# Patient Record
Sex: Female | Born: 1950 | Race: White | Hispanic: No | Marital: Married | Smoking: Former smoker
Health system: Southern US, Community
[De-identification: ages and names within clinical notes are randomized; demographics above are authoritative.]

## PROBLEM LIST (undated history)

## (undated) DIAGNOSIS — G20 Parkinson's disease: Secondary | ICD-10-CM

## (undated) DIAGNOSIS — G20A1 Parkinson's disease without dyskinesia, without mention of fluctuations: Secondary | ICD-10-CM

## (undated) DIAGNOSIS — E039 Hypothyroidism, unspecified: Secondary | ICD-10-CM

## (undated) DIAGNOSIS — G709 Myoneural disorder, unspecified: Secondary | ICD-10-CM

## (undated) DIAGNOSIS — K219 Gastro-esophageal reflux disease without esophagitis: Secondary | ICD-10-CM

## (undated) DIAGNOSIS — E785 Hyperlipidemia, unspecified: Secondary | ICD-10-CM

## (undated) DIAGNOSIS — M810 Age-related osteoporosis without current pathological fracture: Secondary | ICD-10-CM

## (undated) HISTORY — PX: KNEE SURGERY: SHX244

## (undated) HISTORY — PX: APPENDECTOMY: SHX54

---

## 2004-12-20 ENCOUNTER — Ambulatory Visit: Payer: Self-pay | Admitting: Internal Medicine

## 2005-02-25 ENCOUNTER — Ambulatory Visit: Payer: Self-pay | Admitting: Gastroenterology

## 2006-02-11 ENCOUNTER — Ambulatory Visit: Payer: Self-pay | Admitting: Internal Medicine

## 2006-03-11 ENCOUNTER — Ambulatory Visit: Payer: Self-pay | Admitting: Internal Medicine

## 2007-02-24 ENCOUNTER — Ambulatory Visit: Payer: Self-pay | Admitting: Internal Medicine

## 2008-02-25 ENCOUNTER — Ambulatory Visit: Payer: Self-pay | Admitting: Internal Medicine

## 2009-03-29 ENCOUNTER — Ambulatory Visit: Payer: Self-pay | Admitting: Internal Medicine

## 2010-04-02 ENCOUNTER — Ambulatory Visit: Payer: Self-pay | Admitting: Internal Medicine

## 2011-05-29 ENCOUNTER — Ambulatory Visit: Payer: Self-pay | Admitting: Internal Medicine

## 2012-08-05 ENCOUNTER — Ambulatory Visit: Payer: Self-pay | Admitting: Internal Medicine

## 2013-06-29 ENCOUNTER — Ambulatory Visit: Payer: Self-pay | Admitting: Neurology

## 2013-08-17 ENCOUNTER — Ambulatory Visit: Payer: Self-pay | Admitting: Internal Medicine

## 2013-09-01 ENCOUNTER — Emergency Department: Payer: Self-pay | Admitting: Emergency Medicine

## 2013-09-01 LAB — CBC WITH DIFFERENTIAL/PLATELET
Basophil #: 0 10*3/uL (ref 0.0–0.1)
Basophil %: 0.8 %
EOS ABS: 0.1 10*3/uL (ref 0.0–0.7)
Eosinophil %: 1.9 %
HCT: 38.9 % (ref 35.0–47.0)
HGB: 13 g/dL (ref 12.0–16.0)
LYMPHS ABS: 1.7 10*3/uL (ref 1.0–3.6)
LYMPHS PCT: 33 %
MCH: 31.6 pg (ref 26.0–34.0)
MCHC: 33.5 g/dL (ref 32.0–36.0)
MCV: 94 fL (ref 80–100)
Monocyte #: 0.5 x10 3/mm (ref 0.2–0.9)
Monocyte %: 8.6 %
NEUTROS ABS: 2.9 10*3/uL (ref 1.4–6.5)
Neutrophil %: 55.7 %
PLATELETS: 226 10*3/uL (ref 150–440)
RBC: 4.12 10*6/uL (ref 3.80–5.20)
RDW: 13.7 % (ref 11.5–14.5)
WBC: 5.3 10*3/uL (ref 3.6–11.0)

## 2013-09-01 LAB — BASIC METABOLIC PANEL
Anion Gap: 5 — ABNORMAL LOW (ref 7–16)
BUN: 11 mg/dL (ref 7–18)
CO2: 23 mmol/L (ref 21–32)
CREATININE: 0.63 mg/dL (ref 0.60–1.30)
Calcium, Total: 8.8 mg/dL (ref 8.5–10.1)
Chloride: 113 mmol/L — ABNORMAL HIGH (ref 98–107)
EGFR (African American): >60
EGFR (Non-African Amer.): >60
GLUCOSE: 109 mg/dL — AB (ref 65–99)
OSMOLALITY: 281 (ref 275–301)
POTASSIUM: 3.8 mmol/L (ref 3.5–5.1)
SODIUM: 141 mmol/L (ref 136–145)

## 2013-09-01 LAB — T4, FREE: Free Thyroxine: 1.12 ng/dL (ref 0.76–1.46)

## 2013-09-01 LAB — URINALYSIS, COMPLETE
BILIRUBIN, UR: NEGATIVE
Bacteria: NONE SEEN
Blood: NEGATIVE
GLUCOSE, UR: NEGATIVE mg/dL (ref 0–75)
KETONE: NEGATIVE
Nitrite: NEGATIVE
Ph: 7 (ref 4.5–8.0)
Protein: NEGATIVE
RBC,UR: NONE SEEN /HPF (ref 0–5)
SPECIFIC GRAVITY: 1.006 (ref 1.003–1.030)
Squamous Epithelial: 2
WBC UR: <1 /HPF (ref 0–5)

## 2013-09-01 LAB — TROPONIN I: Troponin-I: <0.02 ng/mL

## 2014-08-09 ENCOUNTER — Other Ambulatory Visit: Payer: Self-pay | Admitting: Internal Medicine

## 2014-08-09 DIAGNOSIS — Z1231 Encounter for screening mammogram for malignant neoplasm of breast: Secondary | ICD-10-CM

## 2014-08-23 ENCOUNTER — Ambulatory Visit
Admission: RE | Admit: 2014-08-23 | Discharge: 2014-08-23 | Disposition: A | Discharge status: Home / Self Care | Payer: BC Managed Care – PPO | Source: Ambulatory Visit | Attending: Internal Medicine | Admitting: Internal Medicine

## 2014-08-23 DIAGNOSIS — Z1231 Encounter for screening mammogram for malignant neoplasm of breast: Secondary | ICD-10-CM | POA: Insufficient documentation

## 2014-09-14 ENCOUNTER — Encounter: Payer: Self-pay | Admitting: *Deleted

## 2014-09-28 NOTE — Discharge Instructions (Signed)
[facility] °[facility] ° °POST OPERATIVE INSTRUCTIONS FOR DR. TROXLER AND DR. FOWLER °[facility] ° ° °1. Take your medication as prescribed.  Pain medication should be taken only as needed. ° °2. Keep the dressing clean, dry and intact. ° °3. Keep your foot elevated above the heart level for the first 48 hours. ° °4. Walking to the bathroom and brief periods of walking are acceptable, unless we have instructed you to be non-weight bearing. ° °5. Always wear your post-op shoe when walking.  Always use your crutches if you are to be non-weight bearing. ° °6. Do not take a shower. Baths are permissible as long as the foot is kept out of the water.  ° °7. Every hour you are awake:  °- Bend your knee 15 times. °- Flex foot 15 times °- Massage calf 15 times ° °8. Call [facility] ([phone]) if any of the following problems occur: °- You develop a temperature or fever. °- The bandage becomes saturated with blood. °- Medication does not stop your pain. °- Injury of the foot occurs. °- Any symptoms of infection including redness, odor, or red streaks running from wound. °-  ° °General Anesthesia, Care After °Refer to this sheet in the next few weeks. These instructions provide you with information on caring for yourself after your procedure. Your health care provider may also give you more specific instructions. Your treatment has been planned according to current medical practices, but problems sometimes occur. Call your health care provider if you have any problems or questions after your procedure. °WHAT TO EXPECT AFTER THE PROCEDURE °After the procedure, it is typical to experience: °· Sleepiness. °· Nausea and vomiting. °HOME CARE INSTRUCTIONS °· For the first 24 hours after general anesthesia: °¨ Have a responsible person with you. °¨ Do not drive a car. If you are alone, do not take public transportation. °¨ Do not drink alcohol. °¨ Do not take medicine  that has not been prescribed by your health care provider. °¨ Do not sign important papers or make important decisions. °¨ You may resume a normal diet and activities as directed by your health care provider. °· Change bandages (dressings) as directed. °· If you have questions or problems that seem related to general anesthesia, call the hospital and ask for the anesthetist or anesthesiologist on call. °SEEK MEDICAL CARE IF: °· You have nausea and vomiting that continue the day after anesthesia. °· You develop a rash. °SEEK IMMEDIATE MEDICAL CARE IF:  °· You have difficulty breathing. °· You have chest pain. °· You have any allergic problems. °Document Released: 07/08/2000 Document Revised: 04/06/2013 Document Reviewed: 10/15/2012 °[employer]® Patient Information ©2015 [employer]. This information is not intended to replace advice given to you by your health care provider. Make sure you discuss any questions you have with your health care provider. ° °

## 2014-09-29 ENCOUNTER — Ambulatory Visit
Admission: RE | Admit: 2014-09-29 | Discharge: 2014-09-29 | Disposition: A | Discharge status: Home / Self Care | Payer: BC Managed Care – PPO | Source: Ambulatory Visit | Attending: Podiatry | Admitting: Podiatry

## 2014-09-29 ENCOUNTER — Encounter
Admission: RE | Disposition: A | Discharge status: Home / Self Care | Payer: Self-pay | Source: Ambulatory Visit | Attending: Podiatry

## 2014-09-29 ENCOUNTER — Ambulatory Visit: Payer: BC Managed Care – PPO | Admitting: Anesthesiology

## 2014-09-29 DIAGNOSIS — G8929 Other chronic pain: Secondary | ICD-10-CM | POA: Insufficient documentation

## 2014-09-29 DIAGNOSIS — M2011 Hallux valgus (acquired), right foot: Secondary | ICD-10-CM | POA: Diagnosis not present

## 2014-09-29 HISTORY — DX: Gastro-esophageal reflux disease without esophagitis: K21.9

## 2014-09-29 HISTORY — DX: Age-related osteoporosis without current pathological fracture: M81.0

## 2014-09-29 HISTORY — DX: Hyperlipidemia, unspecified: E78.5

## 2014-09-29 HISTORY — PX: BUNIONECTOMY: SHX129

## 2014-09-29 HISTORY — DX: Parkinson's disease: G20

## 2014-09-29 HISTORY — DX: Parkinson's disease without dyskinesia, without mention of fluctuations: G20.A1

## 2014-09-29 HISTORY — DX: Hypothyroidism, unspecified: E03.9

## 2014-09-29 HISTORY — DX: Myoneural disorder, unspecified: G70.9

## 2014-09-29 SURGERY — BUNIONECTOMY
Anesthesia: General | Laterality: Right | Wound class: Clean

## 2014-09-29 MED ORDER — ONDANSETRON HCL 4 MG/2ML IJ SOLN: 4.0000 mg | Freq: Once | INTRAMUSCULAR | Status: DC | PRN

## 2014-09-29 MED ORDER — FENTANYL CITRATE (PF) 100 MCG/2ML IJ SOLN
INTRAMUSCULAR | Status: DC | PRN
  Administered 2014-09-29 (×2): 25 ug via INTRAVENOUS
  Administered 2014-09-29: 50 ug via INTRAVENOUS

## 2014-09-29 MED ORDER — LACTATED RINGERS IV SOLN
INTRAVENOUS | Status: DC
  Administered 2014-09-29: 07:00:00 via INTRAVENOUS

## 2014-09-29 MED ORDER — MIDAZOLAM HCL 5 MG/5ML IJ SOLN
INTRAMUSCULAR | Status: DC | PRN
  Administered 2014-09-29: 2 mg via INTRAVENOUS

## 2014-09-29 MED ORDER — PROPOFOL 10 MG/ML IV BOLUS
INTRAVENOUS | Status: DC | PRN
  Administered 2014-09-29: 100 mg via INTRAVENOUS

## 2014-09-29 MED ORDER — DEXAMETHASONE SODIUM PHOSPHATE 4 MG/ML IJ SOLN
INTRAMUSCULAR | Status: DC | PRN
  Administered 2014-09-29: 4 mg via INTRAVENOUS

## 2014-09-29 MED ORDER — ONDANSETRON HCL 4 MG/2ML IJ SOLN
INTRAMUSCULAR | Status: DC | PRN
  Administered 2014-09-29: 4 mg via INTRAVENOUS

## 2014-09-29 MED ORDER — BUPIVACAINE HCL (PF) 0.5 % IJ SOLN
INTRAMUSCULAR | Status: DC | PRN
  Administered 2014-09-29: 7 mL

## 2014-09-29 MED ORDER — OXYCODONE HCL 5 MG PO TABS: 5.0000 mg | ORAL_TABLET | Freq: Once | ORAL | Status: DC | PRN

## 2014-09-29 MED ORDER — FENTANYL CITRATE (PF) 100 MCG/2ML IJ SOLN: 25.0000 ug | INTRAMUSCULAR | Status: DC | PRN

## 2014-09-29 MED ORDER — LIDOCAINE HCL (CARDIAC) 20 MG/ML IV SOLN
INTRAVENOUS | Status: DC | PRN
  Administered 2014-09-29: 40 mg via INTRAVENOUS

## 2014-09-29 MED ORDER — OXYCODONE HCL 5 MG/5ML PO SOLN: 5.0000 mg | Freq: Once | ORAL | Status: DC | PRN

## 2014-09-29 MED ORDER — SUCCINYLCHOLINE CHLORIDE 20 MG/ML IJ SOLN
INTRAMUSCULAR | Status: DC | PRN
  Administered 2014-09-29: 80 mg via INTRAVENOUS

## 2014-09-29 MED ORDER — HYDROCODONE-ACETAMINOPHEN 5-325 MG PO TABS: 1.0000 | ORAL_TABLET | Freq: Four times a day (QID) | ORAL | Status: AC | PRN

## 2014-09-29 MED ORDER — CEFAZOLIN SODIUM-DEXTROSE 2-3 GM-% IV SOLR
2.0000 g | Freq: Once | INTRAVENOUS | Status: AC
  Administered 2014-09-29: 2 g via INTRAVENOUS

## 2014-09-29 MED ORDER — BUPIVACAINE HCL (PF) 0.25 % IJ SOLN
INTRAMUSCULAR | Status: DC | PRN
  Administered 2014-09-29: 8 mL

## 2014-09-29 MED ORDER — KETOROLAC TROMETHAMINE 30 MG/ML IJ SOLN: 30.0000 mg | Freq: Once | INTRAMUSCULAR | Status: DC | PRN

## 2014-09-29 MED ORDER — ACETAMINOPHEN 10 MG/ML IV SOLN
INTRAVENOUS | Status: DC | PRN
  Administered 2014-09-29: 1000 mg via INTRAVENOUS

## 2014-09-29 SURGICAL SUPPLY — 65 items
BANDAGE ELASTIC 4 CLIP NS LF (GAUZE/BANDAGES/DRESSINGS) IMPLANT
BENZOIN TINCTURE PRP APPL 2/3 (GAUZE/BANDAGES/DRESSINGS) ×3 IMPLANT
BLADE CRESCENTIC (BLADE) IMPLANT
BLADE MED AGGRESSIVE (BLADE) IMPLANT
BLADE MINI RND TIP GREEN BEAV (BLADE) IMPLANT
BLADE OSC/SAGITTAL 5.5X25 (BLADE) IMPLANT
BLADE OSC/SAGITTAL MD 5.5X18 (BLADE) ×3 IMPLANT
BLADE OSC/SAGITTAL MD 9X18.5 (BLADE) ×3 IMPLANT
BLADE OSCILLATING/SAGITTAL (BLADE)
BLADE SURG 15 STRL LF DISP TIS (BLADE) IMPLANT
BLADE SURG 15 STRL SS (BLADE)
BLADE SW THK.38XMED LNG THN (BLADE) IMPLANT
BNDG ESMARK 4X12 TAN STRL LF (GAUZE/BANDAGES/DRESSINGS) ×3 IMPLANT
BNDG GAUZE 4.5X4.1 6PLY STRL (MISCELLANEOUS) ×3 IMPLANT
BNDG STRETCH 4X75 STRL LF (GAUZE/BANDAGES/DRESSINGS) ×3 IMPLANT
BUR EGG 4X8 MED (BURR) IMPLANT
BUR STRYKR EGG 5.0 (BURR) IMPLANT
CANISTER SUCT 1200ML W/VALVE (MISCELLANEOUS) ×3 IMPLANT
CAST PADDING 3X4FT ST 30246 (SOFTGOODS)
CLOSURE WOUND 1/4X4 (GAUZE/BANDAGES/DRESSINGS) ×1
COVER PIN YLW 0.028-062 (MISCELLANEOUS) IMPLANT
CUFF TOURN SGL QUICK 18 (TOURNIQUET CUFF) ×3 IMPLANT
DRAPE FLUOR MINI C-ARM 54X84 (DRAPES) ×3 IMPLANT
DRILL WIRE PASS (DRILL) IMPLANT
DURAPREP 26ML APPLICATOR (WOUND CARE) ×3 IMPLANT
ETHIBOND 2 0 GREEN CT 2 30IN (SUTURE) IMPLANT
GAUZE PETRO XEROFOAM 1X8 (MISCELLANEOUS) ×3 IMPLANT
GAUZE PETRO XEROFOAM 5X9 (MISCELLANEOUS) IMPLANT
GAUZE SPONGE 4X4 12PLY STRL (GAUZE/BANDAGES/DRESSINGS) ×3 IMPLANT
GLOVE BIO SURGEON STRL SZ7.5 (GLOVE) ×3 IMPLANT
GLOVE BIO SURGEON STRL SZ8 (GLOVE) ×3 IMPLANT
GLOVE INDICATOR 8.0 STRL GRN (GLOVE) ×3 IMPLANT
GOWN STRL REUS W/ TWL XL LVL3 (GOWN DISPOSABLE) ×2 IMPLANT
GOWN STRL REUS W/TWL XL LVL3 (GOWN DISPOSABLE) ×4
K-WIRE DBL END TROCAR 6X.045 (WIRE) ×3
K-WIRE DBL END TROCAR 6X.062 (WIRE) ×3
KWIRE DBL END TROCAR 6X.045 (WIRE) ×1 IMPLANT
KWIRE DBL END TROCAR 6X.062 (WIRE) ×1 IMPLANT
NEEDLE HYPO 18GX1.5 BLUNT FILL (NEEDLE) IMPLANT
NEEDLE HYPO 25GX1X1/2 BEV (NEEDLE) ×3 IMPLANT
PACK EXTREMITY ARMC (MISCELLANEOUS) ×3 IMPLANT
PAD CAST CTTN 3X4 STRL (SOFTGOODS) IMPLANT
PAD GROUND ADULT SPLIT (MISCELLANEOUS) ×3 IMPLANT
RASP SM TEAR CROSS CUT (RASP) ×3 IMPLANT
SPLINT CAST 1 STEP 4X30 (MISCELLANEOUS) ×3 IMPLANT
SPLINT FAST PLASTER 5X30 (CAST SUPPLIES)
SPLINT PLASTER CAST FAST 5X30 (CAST SUPPLIES) IMPLANT
STAPLE SPR MET 9X9X9 1.5 (Staple) ×3 IMPLANT
STOCKINETTE STRL 6IN 960660 (GAUZE/BANDAGES/DRESSINGS) ×3 IMPLANT
STRIP CLOSURE SKIN 1/4X4 (GAUZE/BANDAGES/DRESSINGS) ×2 IMPLANT
SUT ETHILON 4-0 (SUTURE)
SUT ETHILON 4-0 FS2 18XMFL BLK (SUTURE)
SUT ETHILON 5-0 FS-2 18 BLK (SUTURE) IMPLANT
SUT VIC AB 1 CT1 36 (SUTURE) IMPLANT
SUT VIC AB 2-0 CT1 27 (SUTURE)
SUT VIC AB 2-0 CT1 TAPERPNT 27 (SUTURE) IMPLANT
SUT VIC AB 2-0 SH 27 (SUTURE)
SUT VIC AB 2-0 SH 27XBRD (SUTURE) IMPLANT
SUT VIC AB 3-0 SH 27 (SUTURE)
SUT VIC AB 3-0 SH 27X BRD (SUTURE) IMPLANT
SUT VIC AB 4-0 FS2 27 (SUTURE) ×3 IMPLANT
SUT VICRYL AB 3-0 FS1 BRD 27IN (SUTURE) ×3 IMPLANT
SUTURE ETHLN 4-0 FS2 18XMF BLK (SUTURE) IMPLANT
SYRINGE 10CC LL (SYRINGE) ×3 IMPLANT
k-wire ×3 IMPLANT

## 2014-09-29 NOTE — Anesthesia Preprocedure Evaluation (Signed)
Anesthesia Evaluation    Airway Mallampati: II  TM Distance: >3 FB Neck ROM: Full    Dental no notable dental hx.    Pulmonary former smoker,  breath sounds clear to auscultation  Pulmonary exam normal       Cardiovascular Normal cardiovascular examRhythm:Regular Rate:Normal     Neuro/Psych Parkinson's.  Early stages.  No current aspiration problems.    GI/Hepatic GERD-  ,Some Sialorrhea   Endo/Other  Hypothyroidism   Renal/GU      Musculoskeletal   Abdominal   Peds  Hematology   Anesthesia Other Findings   Reproductive/Obstetrics                             Anesthesia Physical Anesthesia Plan  ASA: III  Anesthesia Plan: General   Post-op Pain Management:    Induction: Intravenous  Airway Management Planned: Oral ETT  Additional Equipment:   Intra-op Plan:   Post-operative Plan: Extubation in OR  Informed Consent: I have reviewed the patients History and Physical, chart, labs and discussed the procedure including the risks, benefits and alternatives for the proposed anesthesia with the patient or authorized representative who has indicated his/her understanding and acceptance.   Dental advisory given  Plan Discussed with: CRNA and Anesthesiologist  Anesthesia Plan Comments:         Anesthesia Quick Evaluation

## 2014-09-29 NOTE — Transfer of Care (Signed)
Immediate Anesthesia Transfer of Care Note  Patient: Natalie Sparks  Procedure(s) Performed: Procedure(s): BUNIONECTOMY (Right)  Patient Location: PACU  Anesthesia Type: General  Level of Consciousness: awake, alert  and patient cooperative  Airway and Oxygen Therapy: Patient Spontanous Breathing and Patient connected to supplemental oxygen  Post-op Assessment: Post-op Vital signs reviewed, Patient's Cardiovascular Status Stable, Respiratory Function Stable, Patent Airway and No signs of Nausea or vomiting  Post-op Vital Signs: Reviewed and stable  Complications: No apparent anesthesia complications

## 2014-09-29 NOTE — Op Note (Signed)
Operative note   Surgeon: Dr. Recardo Evangelist, DPM.    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 Hallux abductovalgus correction with first metatarsal osteotomy and phalanx osteotomy right foot     EBL: Less than 5 mL    Anesthesia:IV sedation    Hemostasis: Ankle tourniquet 250 mmHg pressure    Specimen: None  Complications: None    Operative indications: Chronic pain structural deformity unresponsive to conservative care.    Procedure:  Patient was brought into the OR and placed on the operating table in thesupine position. After anesthesia was obtained theright lower extremity was prepped and draped in usual sterile fashion.  Operative Report: At this time attention was directed to the dorsum of the right foot where a 5 cm dorsolinear skin incision made extending of the first metatarsophalangeal joint onto the proximal phalanx shaft. Incision was deepened sharp blunt dissection bleeders clamped and bovied as required. Tissue was identified and incised longitudinally and reflected away from the dorsal medial and plantar aspects of the first metatarsal head. A medial and dorsal medial eminence of bone were noted and resected and rasped smoothly. A 0.045 K wire was then used to serve as an apical axis guide for making the osteotomy cut in the head and neck of the first metatarsal. This time a V osteotomy was performed with apex distal and base proximal. The K wire was removed and the head of metatarsals and transposed to a more lateral position and fixated with a 0.062 K wire. There is checked FluoroScan good position and correction were noted. K wire was then bent and rotated up against the bone. This time the medial shelf of bone remaining was then resected and rasped smoothly. Attention was then directed to the proximal phalanx proximal shaft area where a V osteotomy was  performed with apex lateral base medial this wedge of bone was then removed and the osteotomy together and closed and fixated with a 9 x 9 staple. There is checked FluoroScan good position and correction and fixation were noted. This time after copious irrigation the medial capsulorrhaphy was performed and closed with 3-0 Vicryl simple interrupted sutures. Remaining capsular periosteal tissues and closed with 4 Vicryl in continuous stitch as were deep and superficial fascial layers. Skin was closed with 4 Vicryl subcuticular stitch. This time a block was used with 0.25% Marcaine plain at the base of the first metatarsal. A sterile compressive dressing was placed across the wound consisting of Steri-Strips Xeroform gauze 4 x 4's Kling and Kerlix. The tourniquet was released and prompt complete vascularity seen to return all digits of the right foot. A posterior splint was placed on the right foot leg in the operating room.       Patient tolerated the procedure and anesthesia well.  Was transported from the OR to the PACU with all vital signs stable and vascular status intact. To be discharged per routine protocol.  Will follow up in approximately 1 week in the outpatient clinic.

## 2014-09-29 NOTE — Anesthesia Postprocedure Evaluation (Signed)
  Anesthesia Post-op Note  Patient: Natalie Sparks  Procedure(s) Performed: Procedure(s): BUNIONECTOMY (Right)  Anesthesia type:General  Patient location: PACU  Post pain: Pain level controlled  Post assessment: Post-op Vital signs reviewed, Patient's Cardiovascular Status Stable, Respiratory Function Stable, Patent Airway and No signs of Nausea or vomiting  Post vital signs: Reviewed and stable  Last Vitals:  Filed Vitals:   09/29/14 0900  BP: 127/73  Pulse: 78  Temp:   Resp: 19    Level of consciousness: awake, alert  and patient cooperative  Complications: No apparent anesthesia complications

## 2014-09-29 NOTE — Anesthesia Procedure Notes (Signed)
Procedure Name: Intubation Date/Time: 09/29/2014 7:43 AM Performed by: Andee Poles Pre-anesthesia Checklist: Patient identified, Emergency Drugs available, Suction available, Patient being monitored and Timeout performed Patient Re-evaluated:Patient Re-evaluated prior to inductionOxygen Delivery Method: Circle system utilized Preoxygenation: Pre-oxygenation with 100% oxygen Intubation Type: IV induction Ventilation: Mask ventilation without difficulty Laryngoscope Size: Mac and 3 Grade View: Grade I Tube type: Oral Tube size: 7.0 mm Number of attempts: 1 Placement Confirmation: ETT inserted through vocal cords under direct vision,  positive ETCO2 and breath sounds checked- equal and bilateral Secured at: 20 cm Tube secured with: Tape Dental Injury: Teeth and Oropharynx as per pre-operative assessment

## 2014-09-30 ENCOUNTER — Encounter: Payer: Self-pay | Admitting: Podiatry

## 2015-08-02 ENCOUNTER — Other Ambulatory Visit: Payer: Self-pay | Admitting: Neurology

## 2015-08-02 DIAGNOSIS — M79672 Pain in left foot: Secondary | ICD-10-CM

## 2015-09-29 ENCOUNTER — Other Ambulatory Visit: Payer: Self-pay | Admitting: Internal Medicine

## 2015-09-29 DIAGNOSIS — Z1231 Encounter for screening mammogram for malignant neoplasm of breast: Secondary | ICD-10-CM

## 2015-10-25 ENCOUNTER — Ambulatory Visit: Payer: BC Managed Care – PPO

## 2015-11-07 ENCOUNTER — Ambulatory Visit: Payer: BC Managed Care – PPO

## 2015-11-21 ENCOUNTER — Ambulatory Visit
Admission: RE | Admit: 2015-11-21 | Discharge: 2015-11-21 | Disposition: A | Discharge status: Home / Self Care | Payer: Medicare Other | Source: Ambulatory Visit | Attending: Internal Medicine | Admitting: Internal Medicine

## 2015-11-21 ENCOUNTER — Other Ambulatory Visit: Payer: Self-pay | Admitting: Internal Medicine

## 2015-11-21 DIAGNOSIS — Z1231 Encounter for screening mammogram for malignant neoplasm of breast: Secondary | ICD-10-CM | POA: Insufficient documentation

## 2016-01-29 IMAGING — MG MM DIGITAL SCREENING BILATERAL
5 series · 5 of 5 positions shown · non-contrast
Comparison: Previous exam(s).

CLINICAL DATA: Screening.

EXAM:
DIGITAL SCREENING BILATERAL MAMMOGRAM WITH CAD

[L MLO]
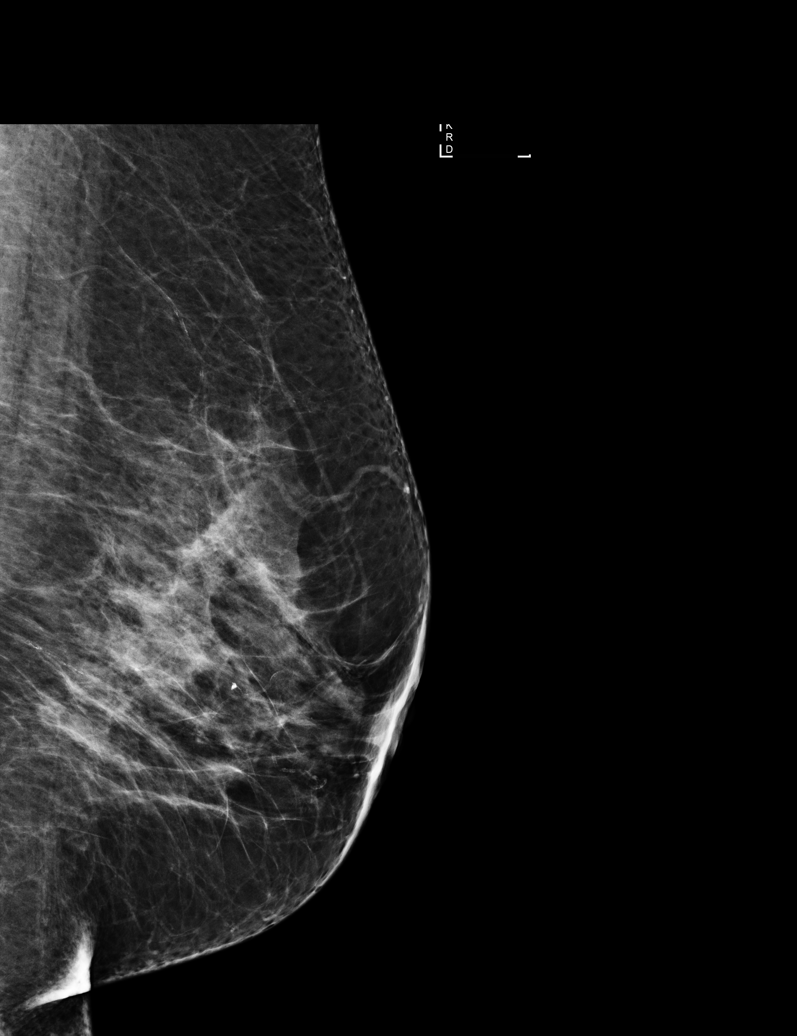

[L CC]
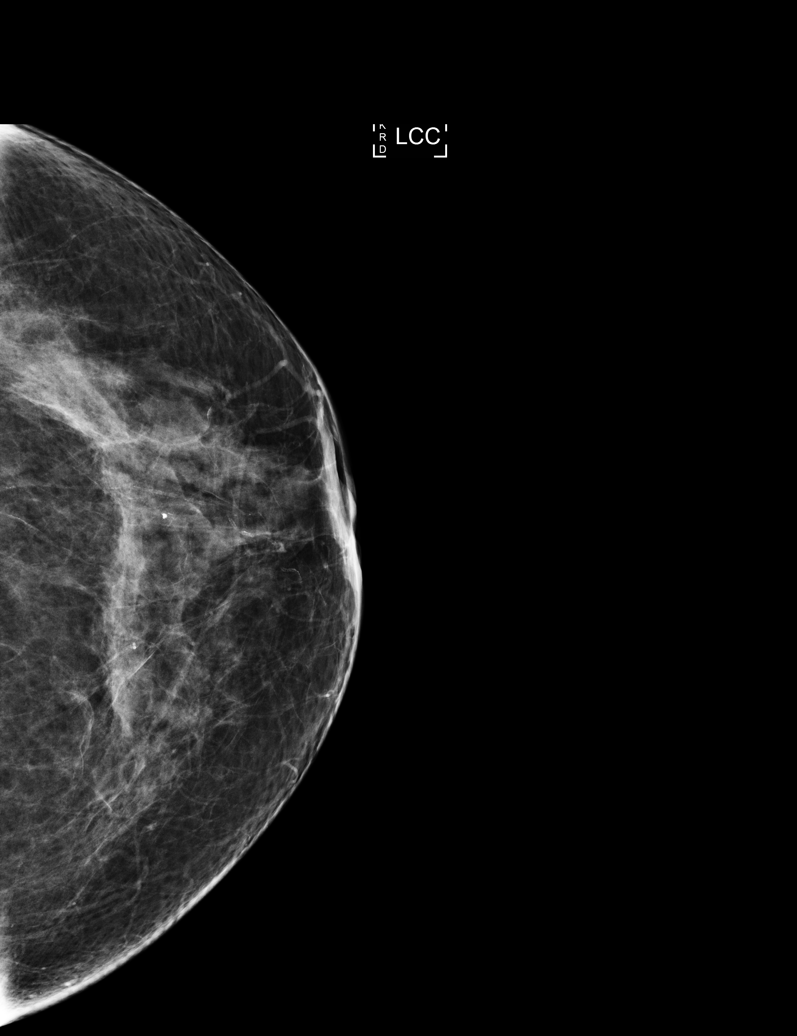

[R CC]
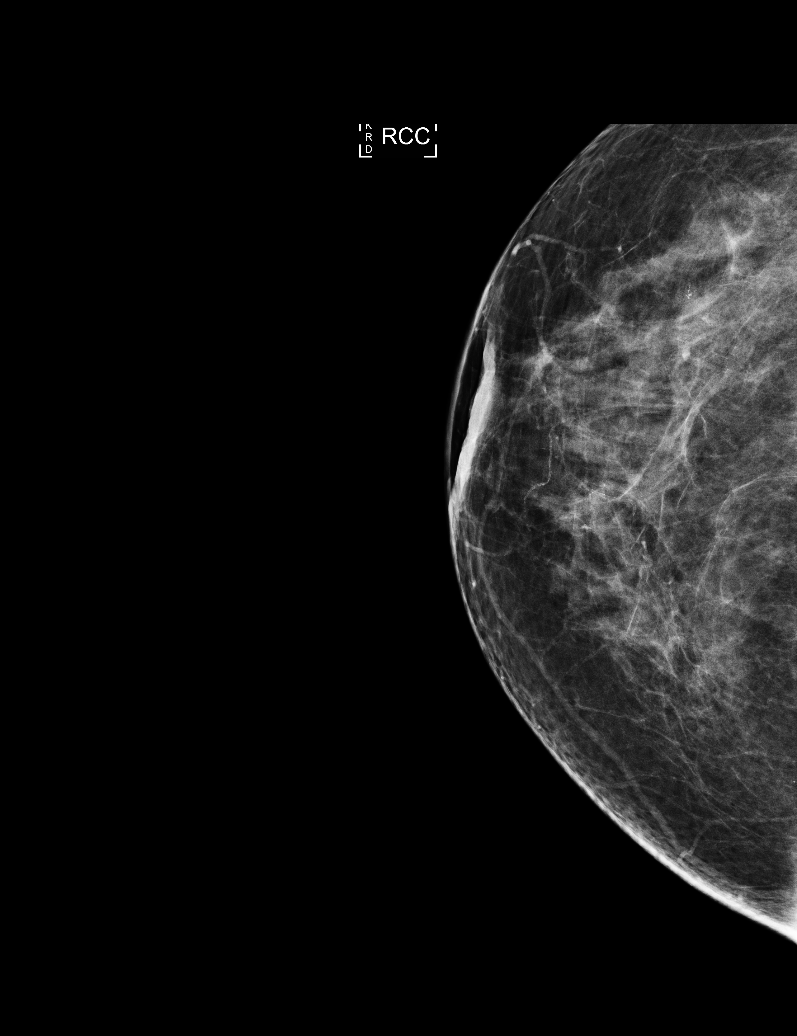

[R XCCL]
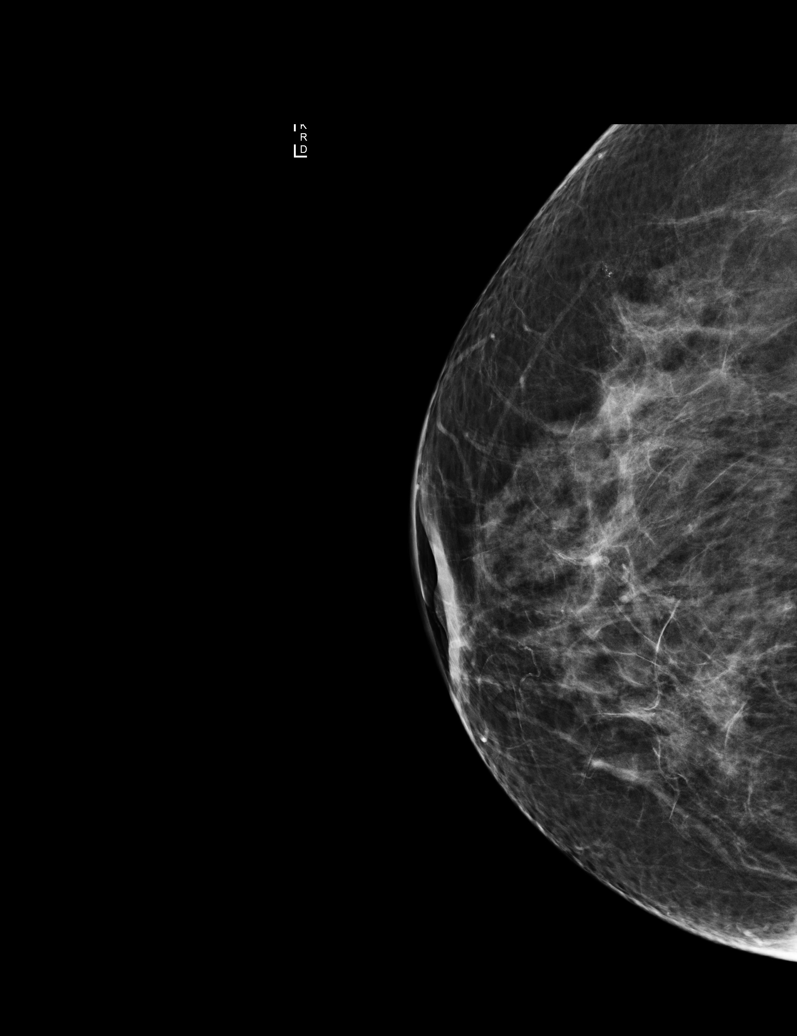

[R MLO]
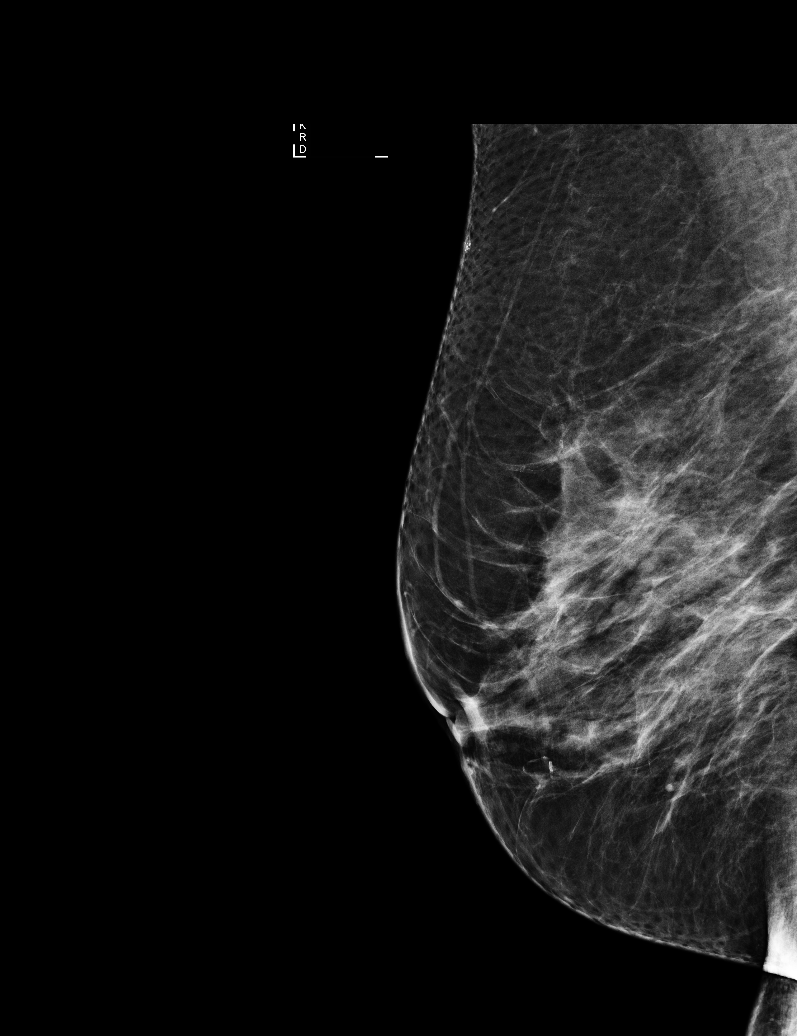

[5 of 5 positions shown; findings below may reference images not displayed]

ACR Breast Density Category c: The breast tissue is heterogeneously
dense, which may obscure small masses.
FINDINGS: There are no findings suspicious for malignancy. Images were
processed with CAD.
IMPRESSION: No mammographic evidence of malignancy. A result letter of this
screening mammogram will be mailed directly to the patient.

RECOMMENDATION:
Screening mammogram in one year. (Code:[id number])

BI-RADS CATEGORY  1: Negative.
